# Patient Record
Sex: Female | Born: 2000 | Race: White | Hispanic: No | Marital: Single | Smoking: Never smoker
Health system: Southern US, Community
[De-identification: ages and names within clinical notes are randomized; demographics above are authoritative.]

## PROBLEM LIST (undated history)

## (undated) DIAGNOSIS — T7840XA Allergy, unspecified, initial encounter: Secondary | ICD-10-CM

## (undated) DIAGNOSIS — M4300 Spondylolysis, site unspecified: Secondary | ICD-10-CM

## (undated) HISTORY — DX: Allergy, unspecified, initial encounter: T78.40XA

## (undated) HISTORY — PX: TYMPANOSTOMY TUBE PLACEMENT: SHX32

## (undated) HISTORY — PX: WISDOM TOOTH EXTRACTION: SHX21

---
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
## 2018-04-03 ENCOUNTER — Encounter: Payer: Self-pay | Admitting: Family Medicine

## 2018-04-03 ENCOUNTER — Ambulatory Visit: Payer: BLUE CROSS/BLUE SHIELD | Admitting: Family Medicine

## 2018-04-03 VITALS — BP 107/68 | HR 117 | Temp 98.3°F | Ht 63.0 in | Wt 111.1 lb

## 2018-04-03 DIAGNOSIS — J111 Influenza due to unidentified influenza virus with other respiratory manifestations: Secondary | ICD-10-CM

## 2018-04-03 DIAGNOSIS — R52 Pain, unspecified: Secondary | ICD-10-CM

## 2018-04-03 LAB — VERITOR FLU A/B WAIVED
Influenza A: NEGATIVE
Influenza B: POSITIVE — AB

## 2018-04-03 MED ORDER — OSELTAMIVIR PHOSPHATE 75 MG PO CAPS: 75.0000 mg | ORAL_CAPSULE | Freq: Two times a day (BID) | ORAL | 0 refills | Status: DC

## 2018-04-03 NOTE — Progress Notes (Signed)
Chief Complaint  Patient presents with  . Generalized Body Aches    HPI  Patient presents today for patient presents with dry cough runny stuffy nose. Diffuse headache of moderate intensity. Patient also has chills and subjective fever. Body aches worst in the back but present in the legs, shoulders, and torso as well. Has sapped the energy to the point that of being unable to perform usual activities other than ADLs. Onset 2 days ago.   PMH: Smoking status noted ROS: Per HPI  Objective: BP 107/68   Pulse (!) 117   Temp 98.3 F (36.8 C) (Oral)   Ht 5\' 3"  (1.6 m)   Wt 111 lb 2 oz (50.4 kg)   BMI 19.68 kg/m  Gen: NAD, alert, cooperative with exam HEENT: NCAT, EOMI, PERRL CV: RRR, good S1/S2, no murmur Resp: CTABL, no wheezes, non-labored Abd: SNTND, BS present, no guarding or organomegaly Ext: No edema, warm Neuro: Alert and oriented, No gross deficits  Assessment and plan:  1. Body aches     No orders of the defined types were placed in this encounter.   Orders Placed This Encounter  Procedures  . Veritor Flu A/B Waived    Order Specific Question:   Source    Answer:   nasal    Follow up as needed.  Mechele Claude, MD

## 2018-09-14 ENCOUNTER — Telehealth: Payer: Self-pay | Admitting: Family Medicine

## 2018-09-14 NOTE — Telephone Encounter (Signed)
appt scheduled Pt notified 

## 2018-09-22 ENCOUNTER — Other Ambulatory Visit: Payer: Self-pay

## 2018-09-22 NOTE — Progress Notes (Signed)
Assessment & Plan:  1. Well adult exam - Preventive education provided. Patient declined HIV screening. Discussed early mammograms due to mother's h/o breast cancer.  - Hepatitis A vaccine adult IM - Meningococcal MCV4O(Menveo) - Meningococcal B, OMV (Bexsero)  2. Immunization due - Hepatitis A vaccine adult IM - Meningococcal MCV4O(Menveo) - Meningococcal B, OMV (Bexsero)  3. Encounter for surveillance of contraceptive pills - drospirenone-ethinyl estradiol (YAZ) 3-0.02 MG tablet; Take 1 tablet by mouth daily.  Dispense: 3 Package; Refill: 3  4. Seasonal allergies - Well controlled on current regimen.  - levocetirizine (XYZAL) 5 MG tablet; Take 1 tablet (5 mg total) by mouth every evening.  Dispense: 90 tablet; Refill: 3 - montelukast (SINGULAIR) 10 MG tablet; Take 1 tablet (10 mg total) by mouth at bedtime.  Dispense: 90 tablet; Refill: 3   Follow-up: Return in about 1 year (around 09/23/2019) for annual physical.   Deliah BostonBritney , MSN, APRN, FNP-C [facility]  Subjective:  Patient ID: April Oneill, female    DOB: June 17, 2000  Age: 18 y.o. MRN: [id number]  Patient Care Team: Dettinger, Elige RadonJoshua A, MD as PCP - General (Family Medicine)   CC:  Chief Complaint  Patient presents with  . Immunizations  . Annual Exam    HPI April Blitzvey Ahlgrim presents for college immunizations and an annual physical.   Occupation: [employer]ical [employer] majoring in Education officer, communitybiochemistry, Marital status: single, Substance use: none Diet: regular, Exercise: none Last eye exam: 3 years ago Last dental exam: regular visits Tdap Vaccine: UTD    DEPRESSION SCREENING PHQ 2/9 Scores 09/23/2018 06/25/2017 04/01/2017 04/06/2015  PHQ - 2 Score 0 0 3 0  PHQ- 9 Score 0 - 11 -     Review of Systems  Constitutional: Negative for chills, fever, malaise/fatigue and weight loss.  HENT: Negative for congestion, ear discharge, ear pain, nosebleeds, sinus pain, sore throat and tinnitus.   Eyes: Negative for blurred  vision, double vision, pain, discharge and redness.  Respiratory: Negative for cough, shortness of breath and wheezing.   Cardiovascular: Negative for chest pain, palpitations and leg swelling.  Gastrointestinal: Negative for abdominal pain, constipation, diarrhea, heartburn, nausea and vomiting.  Genitourinary: Negative for dysuria, frequency and urgency.  Musculoskeletal: Negative for myalgias.  Skin: Negative for rash.  Neurological: Negative for dizziness, seizures, weakness and headaches.  Psychiatric/Behavioral: Negative for depression, substance abuse and suicidal ideas. The patient is not nervous/anxious.     Current Outpatient Medications:  .  drospirenone-ethinyl estradiol (YAZ) 3-0.02 MG tablet, Take 1 tablet by mouth daily., Disp: 3 Package, Rfl: 3 .  levocetirizine (XYZAL) 5 MG tablet, Take 1 tablet (5 mg total) by mouth every evening., Disp: 90 tablet, Rfl: 3 .  montelukast (SINGULAIR) 10 MG tablet, Take 1 tablet (10 mg total) by mouth at bedtime., Disp: 90 tablet, Rfl: 3  Allergies  Allergen Reactions  . Omnicef [Cefdinir] Rash  . Zithromax [Azithromycin] Rash    Past Medical History:  Diagnosis Date  . Allergy   . Spondylolysis 09/15/2017    Past Surgical History:  Procedure Laterality Date  . TYMPANOSTOMY TUBE PLACEMENT  at age 89  . WISDOM TOOTH EXTRACTION      Family History  Problem Relation Age of Onset  . Hypothyroidism Mother   . Breast cancer Mother   . Hip dysplasia Sister   . Deep vein thrombosis Paternal Grandmother     Social History   Socioeconomic History  . Marital status: Single    Spouse name: Not on file  .  Number of children: Not on file  . Years of education: Not on file  . Highest education level: Not on file  Occupational History  . Not on file  Social Needs  . Financial resource strain: Not on file  . Food insecurity    Worry: Not on file    Inability: Not on file  . Transportation needs    Medical: Not on file     Non-medical: Not on file  Tobacco Use  . Smoking status: Never Smoker  . Smokeless tobacco: Never Used  Substance and Sexual Activity  . Alcohol use: No  . Drug use: No  . Sexual activity: Not Currently  Lifestyle  . Physical activity    Days per week: Not on file    Minutes per session: Not on file  . Stress: Not on file  Relationships  . Social Herbalist on phone: Not on file    Gets together: Not on file    Attends religious service: Not on file    Active member of club or organization: Not on file    Attends meetings of clubs or organizations: Not on file    Relationship status: Not on file  . Intimate partner violence    Fear of current or ex partner: Not on file    Emotionally abused: Not on file    Physically abused: Not on file    Forced sexual activity: Not on file  Other Topics Concern  . Not on file  Social History Narrative  . Not on file      Objective:    BP 118/77   Pulse 91   Temp 97.8 F (36.6 C) (Oral)   Ht 5\' 3"  (1.6 m)   Wt 119 lb (54 kg)   BMI 21.08 kg/m   Wt Readings from Last 3 Encounters:  09/23/18 119 lb (54 kg) (32 %, Z= -0.46)*  04/03/18 111 lb 2 oz (50.4 kg) (18 %, Z= -0.90)*  06/25/17 114 lb (51.7 kg) (27 %, Z= -0.62)*   * Growth percentiles are based on CDC (Girls, 2-20 Years) data.    Physical Exam Vitals signs reviewed. Exam conducted with a chaperone present.  Constitutional:      General: She is not in acute distress.    Appearance: Normal appearance. She is normal weight. She is not ill-appearing, toxic-appearing or diaphoretic.  HENT:     Head: Normocephalic and atraumatic.     Right Ear: Tympanic membrane, ear canal and external ear normal. There is no impacted cerumen.     Left Ear: Tympanic membrane, ear canal and external ear normal. There is no impacted cerumen.     Nose: Nose normal. No congestion or rhinorrhea.     Mouth/Throat:     Mouth: Mucous membranes are moist.     Pharynx: Oropharynx is clear.  No oropharyngeal exudate or posterior oropharyngeal erythema.  Eyes:     General: No scleral icterus.       Right eye: No discharge.        Left eye: No discharge.     Conjunctiva/sclera: Conjunctivae normal.     Pupils: Pupils are equal, round, and reactive to light.  Neck:     Musculoskeletal: Normal range of motion and neck supple. No neck rigidity or muscular tenderness.  Cardiovascular:     Rate and Rhythm: Normal rate and regular rhythm.     Heart sounds: Normal heart sounds. No murmur. No friction rub. No gallop.  Pulmonary:     Effort: Pulmonary effort is normal. No respiratory distress.     Breath sounds: Normal breath sounds. No stridor. No wheezing, rhonchi or rales.  Chest:     Chest wall: No mass, lacerations, deformity, swelling, tenderness, crepitus or edema. There is no dullness to percussion.     Breasts: Tanner Score is 4. Breasts are symmetrical.        Right: Normal.        Left: Normal.  Abdominal:     General: Abdomen is flat. Bowel sounds are normal. There is no distension.     Palpations: Abdomen is soft. There is no mass.     Tenderness: There is no abdominal tenderness. There is no guarding or rebound.     Hernia: No hernia is present.  Musculoskeletal: Normal range of motion.  Lymphadenopathy:     Cervical: No cervical adenopathy.     Upper Body:     Right upper body: No supraclavicular, axillary or pectoral adenopathy.     Left upper body: No supraclavicular, axillary or pectoral adenopathy.  Skin:    General: Skin is warm and dry.     Capillary Refill: Capillary refill takes less than 2 seconds.  Neurological:     General: No focal deficit present.     Mental Status: She is alert and oriented to person, place, and time. Mental status is at baseline.  Psychiatric:        Mood and Affect: Mood normal.        Behavior: Behavior normal.        Thought Content: Thought content normal.        Judgment: Judgment normal.     Lab Results  Component  Value Date   TSH 1.090 06/25/2017   Lab Results  Component Value Date   WBC 5.9 06/25/2017   HGB 12.9 06/25/2017   HCT 38.4 06/25/2017   MCV 84 06/25/2017   PLT 278 06/25/2017   Lab Results  Component Value Date   NA 137 06/25/2017   K 5.8 (H) 06/25/2017   CO2 22 06/25/2017   GLUCOSE 87 06/25/2017   BUN 8 06/25/2017   CREATININE 0.84 06/25/2017   BILITOT 0.2 06/25/2017   ALKPHOS 61 06/25/2017   AST 18 06/25/2017   ALT 11 06/25/2017   PROT 6.9 06/25/2017   ALBUMIN 4.1 06/25/2017   CALCIUM 9.3 06/25/2017

## 2018-09-23 ENCOUNTER — Ambulatory Visit (INDEPENDENT_AMBULATORY_CARE_PROVIDER_SITE_OTHER): Payer: BC Managed Care – PPO | Admitting: Family Medicine

## 2018-09-23 ENCOUNTER — Encounter: Payer: Self-pay | Admitting: Family Medicine

## 2018-09-23 VITALS — BP 118/77 | HR 91 | Temp 97.8°F | Ht 63.0 in | Wt 119.0 lb

## 2018-09-23 DIAGNOSIS — J302 Other seasonal allergic rhinitis: Secondary | ICD-10-CM | POA: Diagnosis not present

## 2018-09-23 DIAGNOSIS — Z0001 Encounter for general adult medical examination with abnormal findings: Secondary | ICD-10-CM | POA: Diagnosis not present

## 2018-09-23 DIAGNOSIS — Z3041 Encounter for surveillance of contraceptive pills: Secondary | ICD-10-CM | POA: Diagnosis not present

## 2018-09-23 DIAGNOSIS — Z23 Encounter for immunization: Secondary | ICD-10-CM

## 2018-09-23 DIAGNOSIS — Z0000 Encounter for general adult medical examination without abnormal findings: Secondary | ICD-10-CM

## 2018-09-23 MED ORDER — DROSPIRENONE-ETHINYL ESTRADIOL 3-0.02 MG PO TABS: 1.0000 | ORAL_TABLET | Freq: Every day | ORAL | 3 refills | Status: AC

## 2018-09-23 MED ORDER — LEVOCETIRIZINE DIHYDROCHLORIDE 5 MG PO TABS: 5.0000 mg | ORAL_TABLET | Freq: Every evening | ORAL | 3 refills | Status: DC

## 2018-09-23 MED ORDER — MONTELUKAST SODIUM 10 MG PO TABS: 10.0000 mg | ORAL_TABLET | Freq: Every day | ORAL | 3 refills | Status: DC

## 2018-09-23 NOTE — Patient Instructions (Signed)
Preventive Care 18-21 Years Old, Female Preventive care refers to lifestyle choices and visits with your health care provider that can promote health and wellness. At this stage in your life, you may start seeing a primary care physician instead of a pediatrician. Your health care is now your responsibility. Preventive care for young adults includes:  A yearly physical exam. This is also called an annual wellness visit.  Regular dental and eye exams.  Immunizations.  Screening for certain conditions.  Healthy lifestyle choices, such as diet and exercise. What can I expect for my preventive care visit? Physical exam Your health care provider may check:  Height and weight. These may be used to calculate body mass index (BMI), which is a measurement that tells if you are at a healthy weight.  Heart rate and blood pressure.  Body temperature. Counseling Your health care provider may ask you questions about:  Past medical problems and family medical history.  Alcohol, tobacco, and drug use.  Home and relationship well-being.  Access to firearms.  Emotional well-being.  Diet, exercise, and sleep habits.  Sexual activity and sexual health.  Method of birth control.  Menstrual cycle.  Pregnancy history. What immunizations do I need?  Influenza (flu) vaccine  This is recommended every year. Tetanus, diphtheria, and pertussis (Tdap) vaccine  You may need a Td booster every 10 years. Varicella (chickenpox) vaccine  You may need this vaccine if you have not already been vaccinated. Human papillomavirus (HPV) vaccine  If recommended by your health care provider, you may need three doses over 6 months. Measles, mumps, and rubella (MMR) vaccine  You may need at least one dose of MMR. You may also need a second dose. Meningococcal conjugate (MenACWY) vaccine  One dose is recommended if you are 19-21 years old and a first-year college student living in a residence hall,  or if you have one of several medical conditions. You may also need additional booster doses. Pneumococcal conjugate (PCV13) vaccine  You may need this if you have certain conditions and were not previously vaccinated. Pneumococcal polysaccharide (PPSV23) vaccine  You may need one or two doses if you smoke cigarettes or if you have certain conditions. Hepatitis A vaccine  You may need this if you have certain conditions or if you travel or work in places where you may be exposed to hepatitis A. Hepatitis B vaccine  You may need this if you have certain conditions or if you travel or work in places where you may be exposed to hepatitis B. Haemophilus influenzae type b (Hib) vaccine  You may need this if you have certain risk factors. You may receive vaccines as individual doses or as more than one vaccine together in one shot (combination vaccines). Talk with your health care provider about the risks and benefits of combination vaccines. What tests do I need? Blood tests  Lipid and cholesterol levels. These may be checked every 5 years starting at age 20.  Hepatitis C test.  Hepatitis B test. Screening  Pelvic exam and Pap test. This may be done every 3 years starting at age 21.  Sexually transmitted disease (STD) testing, if you are at risk.  BRCA-related cancer screening. This may be done if you have a family history of breast, ovarian, tubal, or peritoneal cancers. Other tests  Tuberculosis skin test.  Vision and hearing tests.  Skin exam.  Breast exam. Follow these instructions at home: Eating and drinking   Eat a diet that includes fresh fruits and   vegetables, whole grains, lean protein, and low-fat dairy products.  Drink enough fluid to keep your urine pale yellow.  Do not drink alcohol if: ? Your health care provider tells you not to drink. ? You are pregnant, may be pregnant, or are planning to become pregnant. ? You are under the legal drinking age. In the  [place] the legal drinking age is 21.  If you drink alcohol: ? Limit how much you have to 0-1 drink a day. ? Be aware of how much alcohol is in your drink. In the [place] one drink equals one 12 oz bottle of beer (355 mL), one 5 oz glass of wine (148 mL), or one 1 oz glass of hard liquor (44 mL). Lifestyle  Take daily care of your teeth and gums.  Stay active. Exercise at least 30 minutes 5 or more days of the week.  Do not use any products that contain nicotine or tobacco, such as cigarettes, e-cigarettes, and chewing tobacco. If you need help quitting, ask your health care provider.  Do not use drugs.  If you are sexually active, practice safe sex. Use a condom or other form of birth control (contraception) in order to prevent pregnancy and STIs (sexually transmitted infections). If you plan to become pregnant, see your health care provider for a pre-conception visit.  Find healthy ways to cope with stress, such as: ? Meditation, yoga, or listening to music. ? Journaling. ? Talking to a trusted person. ? Spending time with friends and family. Safety  Always wear your seat belt while driving or riding in a vehicle.  Do not drive if you have been drinking alcohol. Do not ride with someone who has been drinking.  Do not drive when you are tired or distracted. Do not text while driving.  Wear a helmet and other protective equipment during sports activities.  If you have firearms in your house, make sure you follow all gun safety procedures.  Seek help if you have been bullied, physically abused, or sexually abused.  Use the Internet responsibly to avoid dangers such as online bullying and online sex predators. What's next?  Go to your health care provider once a year for a well check visit.  Ask your health care provider how often you should have your eyes and teeth checked.  Stay up to date on all vaccines. This information is not intended to replace advice given to you by  your health care provider. Make sure you discuss any questions you have with your health care provider. Document Released: 06/29/2015 Document Revised: 02/05/2018 Document Reviewed: 02/05/2018 Elsevier Patient Education  2020 [employer]  

## 2018-10-17 DIAGNOSIS — Z20828 Contact with and (suspected) exposure to other viral communicable diseases: Secondary | ICD-10-CM | POA: Diagnosis not present

## 2018-12-22 DIAGNOSIS — L700 Acne vulgaris: Secondary | ICD-10-CM | POA: Diagnosis not present

## 2019-02-24 DIAGNOSIS — H1045 Other chronic allergic conjunctivitis: Secondary | ICD-10-CM | POA: Diagnosis not present

## 2019-02-24 DIAGNOSIS — J310 Chronic rhinitis: Secondary | ICD-10-CM | POA: Diagnosis not present

## 2019-02-24 DIAGNOSIS — J3081 Allergic rhinitis due to animal (cat) (dog) hair and dander: Secondary | ICD-10-CM | POA: Diagnosis not present

## 2019-05-14 DIAGNOSIS — L700 Acne vulgaris: Secondary | ICD-10-CM | POA: Diagnosis not present

## 2019-06-14 DIAGNOSIS — Z20822 Contact with and (suspected) exposure to covid-19: Secondary | ICD-10-CM | POA: Diagnosis not present

## 2019-12-03 DIAGNOSIS — L700 Acne vulgaris: Secondary | ICD-10-CM | POA: Diagnosis not present

## 2020-02-23 DIAGNOSIS — J3081 Allergic rhinitis due to animal (cat) (dog) hair and dander: Secondary | ICD-10-CM | POA: Diagnosis not present

## 2020-02-23 DIAGNOSIS — J310 Chronic rhinitis: Secondary | ICD-10-CM | POA: Diagnosis not present

## 2020-02-23 DIAGNOSIS — H1045 Other chronic allergic conjunctivitis: Secondary | ICD-10-CM | POA: Diagnosis not present

## 2020-04-14 IMAGING — DX DG LUMBAR SPINE 2-3V
2 series · 2 of 2 positions shown · non-contrast
Comparison: None.

CLINICAL DATA: No known injury.  Low back pain.

EXAM:
LUMBAR SPINE - 2-3 VIEW

[l-spine ap]
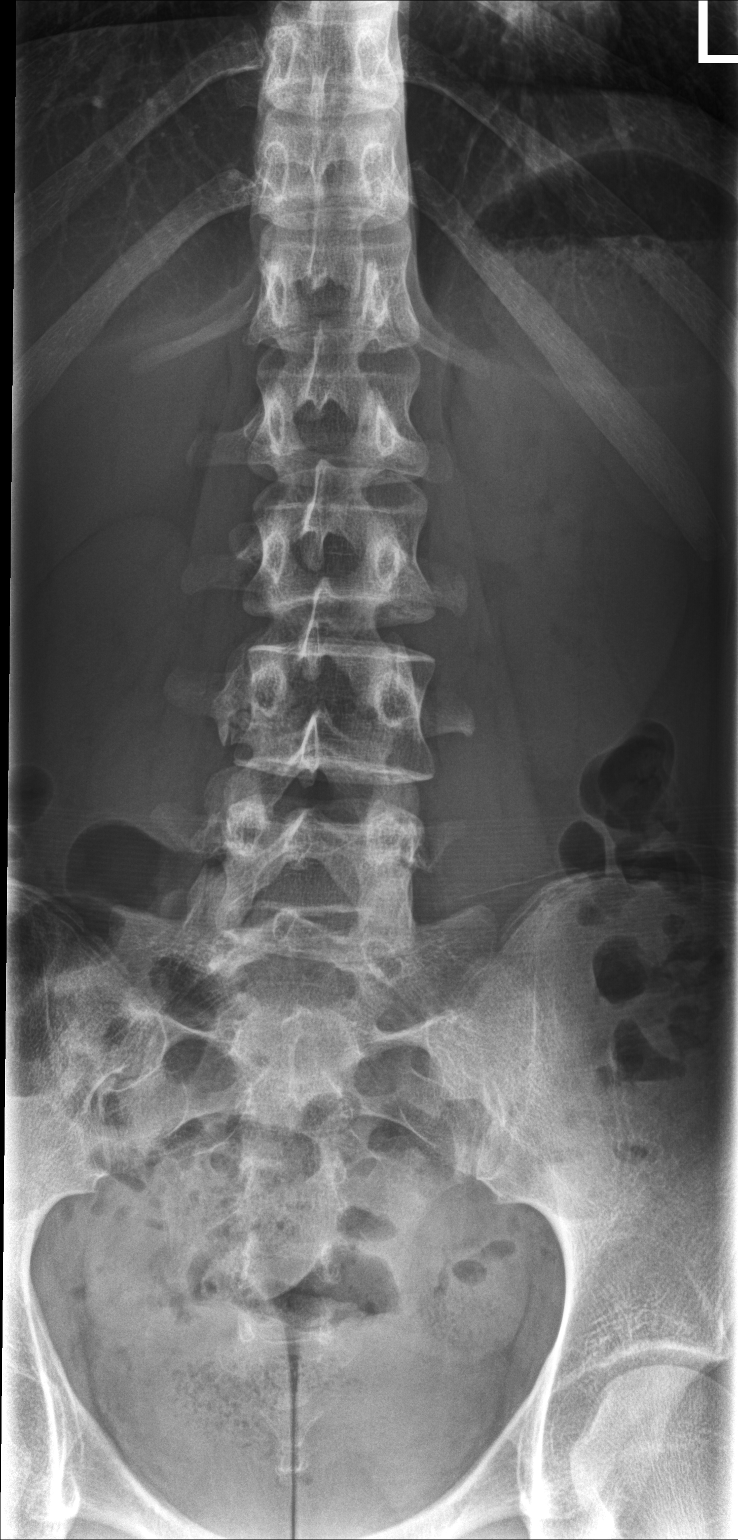

[l-spine lat]
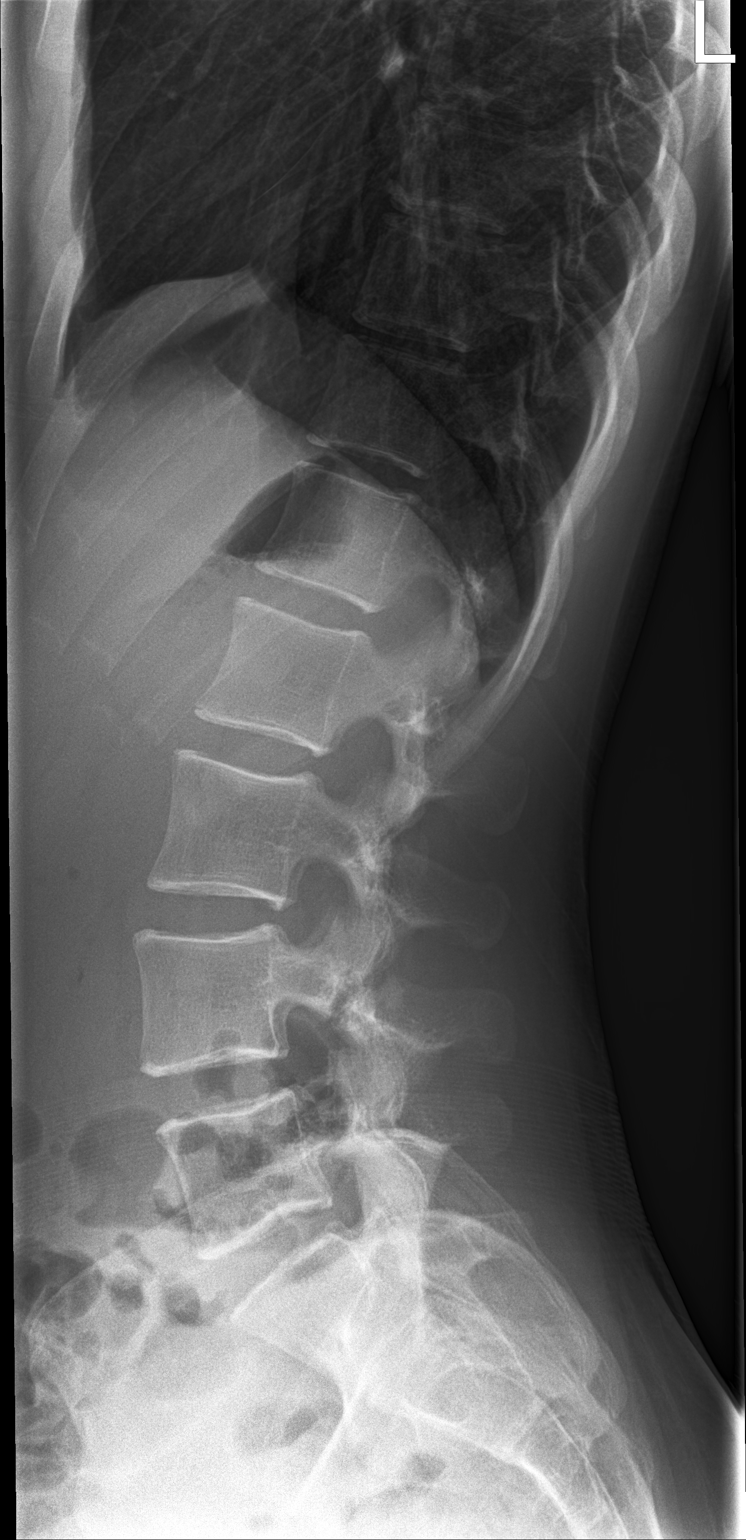

[2 of 2 positions shown; findings below may reference images not displayed]

FINDINGS: A right-sided pars defect is suspected involving the L4 vertebra. No
significant anterolisthesis identified. The alignment of the lumbar
spine appears normal.
IMPRESSION: 1. Suspect L4 pars defect. Consider further evaluation with complete
four view lumbar spine series including oblique radiographs.

## 2020-04-16 IMAGING — DX DG LUMBAR SPINE 2-3V
2 series · 2 of 2 positions shown · non-contrast
Comparison: BILATERAL oblique views are compared to prior AP and
lateral exam of 07/02/2017

CLINICAL DATA: Low back pain when sitting, oblique views requested
for possible pars defect at L4

EXAM:
LUMBAR SPINE - 2-3 VIEW

[l-spine ap (1 of 2)]
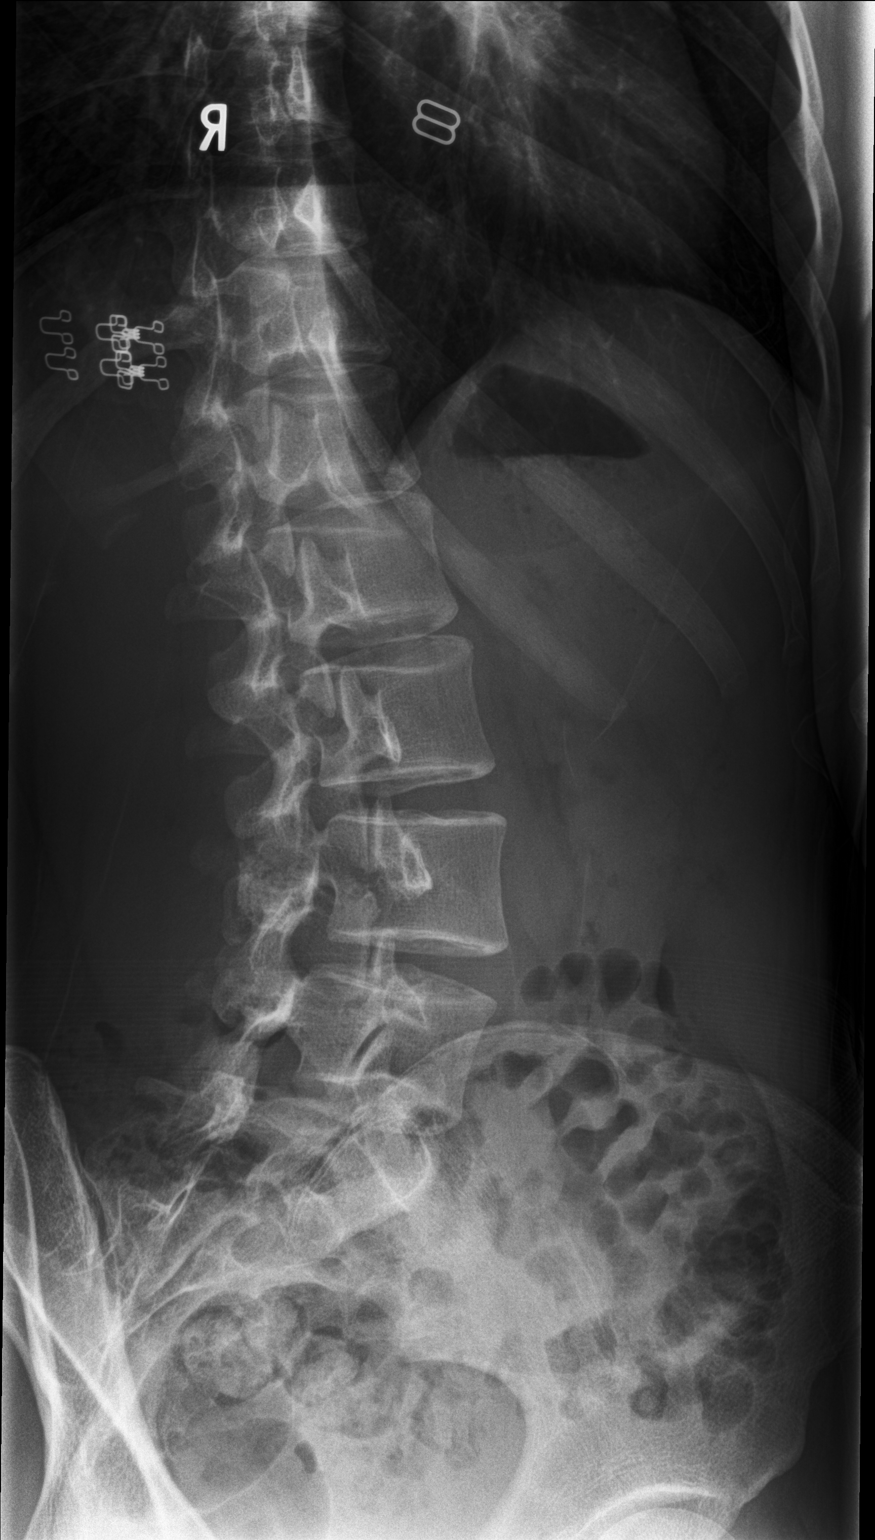

[l-spine ap (2 of 2)]
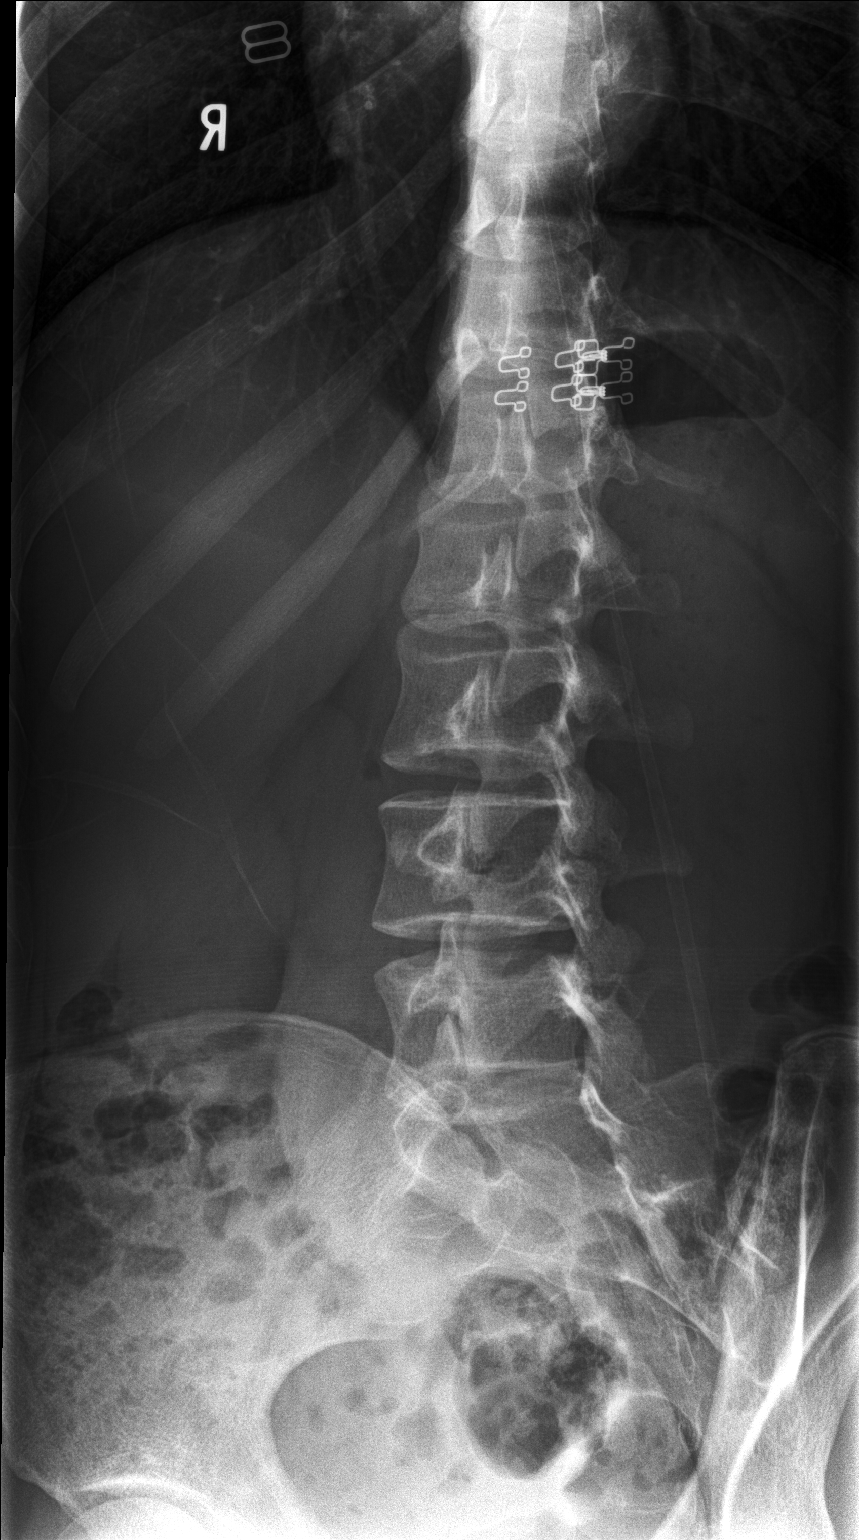

[2 of 2 positions shown; findings below may reference images not displayed]

FINDINGS: Osseous mineralization normal.

LEFT spondylolysis at L4 with questionable RIGHT spondylolysis and
spurring at L4.

No additional fracture or bone destruction.
IMPRESSION: Question BILATERAL spondylolysis at L4 as above.

## 2020-07-27 DIAGNOSIS — Z113 Encounter for screening for infections with a predominantly sexual mode of transmission: Secondary | ICD-10-CM | POA: Diagnosis not present

## 2020-07-27 DIAGNOSIS — B373 Candidiasis of vulva and vagina: Secondary | ICD-10-CM | POA: Diagnosis not present

## 2020-08-22 DIAGNOSIS — R059 Cough, unspecified: Secondary | ICD-10-CM | POA: Diagnosis not present

## 2020-08-22 DIAGNOSIS — J020 Streptococcal pharyngitis: Secondary | ICD-10-CM | POA: Diagnosis not present

## 2020-11-07 ENCOUNTER — Other Ambulatory Visit: Payer: Self-pay

## 2020-11-07 ENCOUNTER — Encounter: Payer: Self-pay | Admitting: Nurse Practitioner

## 2020-11-07 ENCOUNTER — Ambulatory Visit (INDEPENDENT_AMBULATORY_CARE_PROVIDER_SITE_OTHER): Payer: Self-pay | Admitting: Nurse Practitioner

## 2020-11-07 VITALS — BP 108/76 | HR 86 | Temp 97.4°F | Resp 20 | Ht 63.0 in | Wt 114.0 lb

## 2020-11-07 DIAGNOSIS — Z0000 Encounter for general adult medical examination without abnormal findings: Secondary | ICD-10-CM | POA: Diagnosis not present

## 2020-11-07 DIAGNOSIS — Z136 Encounter for screening for cardiovascular disorders: Secondary | ICD-10-CM | POA: Diagnosis not present

## 2020-11-07 NOTE — Progress Notes (Signed)
Subjective:    Patient ID: April Oneill, female    DOB: Feb 13, 2001, 20 y.o.   MRN: [id number]  Chief Complaint: Check up and wants labs    HPI:  1. Annual physical exam Patient in today for physical exam no PAP. She is doing well. LMP 11/03/20. She is on no meds ( other than birth Control ) and has no medical problems.       Outpatient Encounter Medications as of 11/07/2020  Medication Sig   desloratadine (CLARINEX) 5 MG tablet Take 5 mg by mouth daily.   drospirenone-ethinyl estradiol (YAZ) 3-0.02 MG tablet Take 1 tablet by mouth daily.   [DISCONTINUED] levocetirizine (XYZAL) 5 MG tablet Take 1 tablet (5 mg total) by mouth every evening.   [DISCONTINUED] montelukast (SINGULAIR) 10 MG tablet Take 1 tablet (10 mg total) by mouth at bedtime.   No facility-administered encounter medications on file as of 11/07/2020.    Past Surgical History:  Procedure Laterality Date   TYMPANOSTOMY TUBE PLACEMENT  at age 60   WISDOM TOOTH EXTRACTION      Family History  Problem Relation Age of Onset   Hypothyroidism Mother    Breast cancer Mother    Hip dysplasia Sister    Deep vein thrombosis Paternal Grandmother     New complaints: None today  Social history: Lives friends in [place] ,[place]  Controlled substance contract: n/a     Review of Systems  Constitutional:  Negative for diaphoresis.  Eyes:  Negative for pain.  Respiratory:  Negative for shortness of breath.   Cardiovascular:  Negative for chest pain, palpitations and leg swelling.  Gastrointestinal:  Negative for abdominal pain.  Endocrine: Negative for polydipsia.  Skin:  Negative for rash.  Neurological:  Negative for dizziness, weakness and headaches.  Hematological:  Does not bruise/bleed easily.  All other systems reviewed and are negative.     Objective:   Physical Exam Vitals and nursing note reviewed.  Constitutional:      General: She is not in acute distress.    Appearance: Normal appearance. She  is well-developed.  HENT:     Head: Normocephalic.     Right Ear: Tympanic membrane normal.     Left Ear: Tympanic membrane normal.     Nose: Nose normal.     Mouth/Throat:     Mouth: Mucous membranes are moist.  Eyes:     Pupils: Pupils are equal, round, and reactive to light.  Neck:     Vascular: No carotid bruit or JVD.  Cardiovascular:     Rate and Rhythm: Normal rate and regular rhythm.     Heart sounds: Normal heart sounds.  Pulmonary:     Effort: Pulmonary effort is normal. No respiratory distress.     Breath sounds: Normal breath sounds. No wheezing or rales.  Chest:     Chest wall: No tenderness.  Abdominal:     General: Bowel sounds are normal. There is no distension or abdominal bruit.     Palpations: Abdomen is soft. There is no hepatomegaly, splenomegaly, mass or pulsatile mass.     Tenderness: There is no abdominal tenderness.  Musculoskeletal:        General: Normal range of motion.     Cervical back: Normal range of motion and neck supple.  Lymphadenopathy:     Cervical: No cervical adenopathy.  Skin:    General: Skin is warm and dry.  Neurological:     Mental Status: She is alert and oriented to person,  place, and time.     Deep Tendon Reflexes: Reflexes are normal and symmetric.  Psychiatric:        Behavior: Behavior normal.        Thought Content: Thought content normal.        Judgment: Judgment normal.    BP 108/76   Pulse 86   Temp (!) 97.4 F (36.3 C) (Temporal)   Resp 20   Ht '5\' 3"'  (1.6 m)   Wt 114 lb (51.7 kg)   SpO2 96%   BMI 20.19 kg/m        Assessment & Plan:  April Oneill comes in today with chief complaint of Check up and wants labs   Diagnosis and orders addressed:  1. Annual physical exam Orders Placed This Encounter  Procedures   CBC with Differential/Platelet   CMP14+EGFR   Lipid panel   Thyroid Panel With TSH    Labs pending Health Maintenance reviewed Diet and exercise encouraged  Follow up plan: 1  year   Mary-Margaret Hassell Done, FNP

## 2020-11-07 NOTE — Patient Instructions (Signed)
Well Child Nutrition, Young Adult ?This sheet provides general nutrition recommendations. Talk with a health care provider or a diet and nutrition specialist (dietitian) if you have any questions. ?Nutrition ?The amount of food you need to eat every day depends on your age, sex, size, and activity level. To figure out your daily calorie needs, look for a calorie calculator online or talk with your health care provider. ?Balanced diet ?Eat a balanced diet. Try to include: ?Fruits. Aim for 2 cups a day. Examples of 1 cup of fruit include 1 large banana, 1 small apple, 8 large strawberries, or 1 large orange. Eat a variety of whole fruits and 100% fruit juice. Choose fresh, canned, frozen, or dried forms. Choose canned fruit that has the lowest added sugar or no added sugar. ?Vegetables. Aim for 2?-3 cups a day. Examples of 1 cup of vegetables include 2 medium carrots, 1 large tomato, or 2 stalks of celery. Choose fresh, frozen, canned, and dried options. Eat vegetables of a variety of colors. ?Low-fat dairy. Aim for 3 cups a day. Examples of 1 cup of dairy include 8 oz (230 mL) of milk, 8 oz (230 g) of yogurt, or 1? oz (44 g) of natural cheese. Choose fat-free or low-fat dairy products, including milk, yogurt, and cheese. If you are unable to tolerate dairy (lactose intolerant) or you choose not to consume dairy, you may include fortified soy beverages (soy milk). ?Whole grains. Of the grain foods that you eat each day (such as pasta, rice, and tortillas), aim to include 6-8 "ounce-equivalents" of whole-grain options. Examples of 1 ounce-equivalent of whole grains include 1 cup of whole-wheat cereal, ? cup of brown rice, or 1 slice of whole-wheat bread. Try to choose whole grains including brown rice, wild rice, quinoa, and oats. ?Lean proteins. Aim for 5?-6? "ounce-equivalents" a day. Eat a variety of protein foods, including lean meats, seafood, poultry, eggs, legumes (beans and peas), nuts, seeds, and soy  products. ?A cut of meat or fish that is the size of a deck of cards is about 3-4 ounce-equivalents. ?Foods that provide 1 ounce-equivalent of protein include 1 egg, ? cup of nuts or seeds, or 1 tablespoon (16 g) of peanut butter. ?For more information and options for foods in a balanced diet, visit www.choosemyplate.gov ?Tips for healthy snacking ?A snack should not be the size of a full meal. Eat snacks that have 200 calories or less. Examples include: ?? whole-wheat pita with ? cup hummus. ?2 or 3 slices of deli turkey wrapped around a cheese stick. ?? apple with 1 tablespoon of peanut butter. ?10 baked chips with salsa. ?Keep cut-up fruits and vegetables available at home and at school so they are easy to eat. ?Pack healthy snacks the night before or when you pack your lunch. ?Avoid pre-packaged foods. These tend to be higher in fat, sugar, and salt (sodium). ?Get involved with shopping, or ask the primary food shopper in your household to get healthy snacks that you like. ?Avoid chips, candy, cake, and soft drinks. ?Foods to avoid ?Fried or heavily processed foods, such as toaster pastries and microwaveable dinners. ?Drinks that contain a lot of sugar, such as sports drinks, sodas, and juice. ?Foods that contain a lot of fat, sodium, or sugar. ?Food safety ?Prepare your food safely: ?Wash your hands after handling raw meats. ?Keep food preparation surfaces clean by washing them regularly with hot, soapy water. ?Keep raw meats separate from foods that are ready-to-eat, such as fruits and vegetables. ?Cook seafood,   meat, poultry, and eggs to the recommended minimum safe internal temperature. Store foods at safe temperatures. In general: Keep cold foods at 78F (4C) or colder. Keep your freezer at 40F (-18C or 18 degrees below 0C) or colder. Keep hot foods at 178F (60C) or warmer. Foods are no longer safe to eat when they have been at a temperature of 40-178F (4-60C) for more than 2 hours. Physical  activity Try to get 150 minutes of moderate-intensity physical activity each week. Examples include walking briskly or bicycling slower than 10 miles an hour (16 km an hour). Do muscle-strengthening exercises on 2 or more days a week. If you find it difficult to fit regular physical activity into your schedule, try: Taking the stairs instead of the elevator. Parking your car farther from the entrance or at the back of the parking lot. Biking or walking to work or school. If you need to lose weight, you may need to reduce your daily calorie intake and increase your daily amount of physical activity. Check with your health care provider before you start a new diet and exercise plan. General instructions Do not skip meals, especially breakfast. Water is the ideal beverage. Aim to drink six 8-oz glasses of water each day. Avoid fad diets. These may affect your mood and growth. If you choose to consume alcohol: Drink in moderation. This means two drinks a day for men and one drink a day for nonpregnant women. One drink equals 12 oz of beer, 5 oz of wine, or 1 oz of hard liquor. You may drink coffee. It is recommended that you limit coffee intake to three to five 8-oz cups a day (up to 400 mg of caffeine). If you are worried about your body image, talk with your parents, your health care provider, or another trusted adult like a coach or counselor. You may be at risk for developing an eating disorder. Eating disorders can lead to serious medical problems. Food allergies may cause you to have a reaction (such as a rash, diarrhea, or vomiting) after eating or drinking. Talk with your health care provider if you have concerns about food allergies. Summary Eat a balanced diet. Include fruits, vegetables, low-fat dairy, whole grains, and lean proteins. Try to get 150 minutes of moderate-intensity physical activity each week, and do muscle-strengthening exercises on 2 or more days a week. Choose healthy  snacks that are 200 calories or less. Drink plenty of water. Try to drink six 8-oz glasses a day. This information is not intended to replace advice given to you by your health care provider. Make sure you discuss any questions you have with your health care provider. Document Revised: 02/02/2020 Document Reviewed: 02/02/2020 Elsevier Patient Education  2022 [employer].

## 2020-11-08 LAB — CMP14+EGFR
ALT: 13 IU/L (ref 0–32)
AST: 13 IU/L (ref 0–40)
Albumin/Globulin Ratio: 1.5 (ref 1.2–2.2)
Albumin: 4.2 g/dL (ref 3.9–5.0)
Alkaline Phosphatase: 60 IU/L (ref 44–121)
BUN/Creatinine Ratio: 11 (ref 9–23)
BUN: 8 mg/dL (ref 6–20)
Bilirubin Total: <0.2 mg/dL (ref 0.0–1.2)
CO2: 22 mmol/L (ref 20–29)
Calcium: 9 mg/dL (ref 8.7–10.2)
Chloride: 103 mmol/L (ref 96–106)
Creatinine, Ser: 0.76 mg/dL (ref 0.57–1.00)
Globulin, Total: 2.8 g/dL (ref 1.5–4.5)
Glucose: 88 mg/dL (ref 65–99)
Potassium: 3.9 mmol/L (ref 3.5–5.2)
Sodium: 140 mmol/L (ref 134–144)
Total Protein: 7 g/dL (ref 6.0–8.5)
eGFR: 114 mL/min/{1.73_m2} (ref >59)

## 2020-11-08 LAB — LIPID PANEL
Chol/HDL Ratio: 1.7 ratio (ref 0.0–4.4)
Cholesterol, Total: 135 mg/dL (ref 100–199)
HDL: 78 mg/dL (ref >39)
LDL Chol Calc (NIH): 44 mg/dL (ref 0–99)
Triglycerides: 64 mg/dL (ref 0–149)
VLDL Cholesterol Cal: 13 mg/dL (ref 5–40)

## 2020-11-08 LAB — CBC WITH DIFFERENTIAL/PLATELET
Basophils Absolute: 0 10*3/uL (ref 0.0–0.2)
Basos: 1 %
EOS (ABSOLUTE): 0.1 10*3/uL (ref 0.0–0.4)
Eos: 1 %
Hematocrit: 37.1 % (ref 34.0–46.6)
Hemoglobin: 12.8 g/dL (ref 11.1–15.9)
Immature Grans (Abs): 0 10*3/uL (ref 0.0–0.1)
Immature Granulocytes: 0 %
Lymphocytes Absolute: 2.8 10*3/uL (ref 0.7–3.1)
Lymphs: 39 %
MCH: 28.3 pg (ref 26.6–33.0)
MCHC: 34.5 g/dL (ref 31.5–35.7)
MCV: 82 fL (ref 79–97)
Monocytes Absolute: 0.6 10*3/uL (ref 0.1–0.9)
Monocytes: 8 %
Neutrophils Absolute: 3.8 10*3/uL (ref 1.4–7.0)
Neutrophils: 51 %
Platelets: 255 10*3/uL (ref 150–450)
RBC: 4.53 x10E6/uL (ref 3.77–5.28)
RDW: 12.7 % (ref 11.7–15.4)
WBC: 7.3 10*3/uL (ref 3.4–10.8)

## 2020-11-08 LAB — THYROID PANEL WITH TSH
Free Thyroxine Index: 2.8 (ref 1.2–4.9)
T3 Uptake Ratio: 26 % (ref 24–39)
T4, Total: 10.8 ug/dL (ref 4.5–12.0)
TSH: 0.891 u[IU]/mL (ref 0.450–4.500)

## 2021-01-05 DIAGNOSIS — L700 Acne vulgaris: Secondary | ICD-10-CM | POA: Diagnosis not present
